# Patient Record
Sex: Female | Born: 1995
Health system: Southern US, Community
[De-identification: ages and names within clinical notes are randomized; demographics above are authoritative.]

## PROBLEM LIST (undated history)

## (undated) DIAGNOSIS — A749 Chlamydial infection, unspecified: Secondary | ICD-10-CM

## (undated) DIAGNOSIS — N390 Urinary tract infection, site not specified: Secondary | ICD-10-CM

## (undated) HISTORY — DX: Chlamydial infection, unspecified: A74.9

## (undated) HISTORY — DX: Urinary tract infection, site not specified: N39.0

## (undated) HISTORY — PX: NO PAST SURGERIES: SHX2092

---
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
## 2020-06-18 ENCOUNTER — Ambulatory Visit: Payer: Self-pay

## 2020-08-05 ENCOUNTER — Ambulatory Visit (LOCAL_COMMUNITY_HEALTH_CENTER): Payer: Self-pay

## 2020-08-05 ENCOUNTER — Other Ambulatory Visit: Payer: Self-pay

## 2020-08-05 VITALS — BP 105/69 | Ht 63.0 in | Wt 176.0 lb

## 2020-08-05 DIAGNOSIS — Z3201 Encounter for pregnancy test, result positive: Secondary | ICD-10-CM

## 2020-08-05 LAB — PREGNANCY, URINE: Preg Test, Ur: POSITIVE — AB

## 2020-08-05 MED ORDER — PRENATAL 27-0.8 MG PO TABS: 1.0000 | ORAL_TABLET | Freq: Every day | ORAL | 0 refills | Status: AC

## 2020-08-05 NOTE — Progress Notes (Signed)
UPT positive. Plans prenatal care at [facility].  To clerk for preadmit and prenatal appt.  Jerel Shepherd, RN

## 2021-02-16 ENCOUNTER — Other Ambulatory Visit: Payer: Self-pay

## 2021-02-16 ENCOUNTER — Emergency Department
Admission: EM | Admit: 2021-02-16 | Discharge: 2021-02-17 | Disposition: A | Discharge status: Home / Self Care | Payer: Self-pay | Attending: Emergency Medicine | Admitting: Emergency Medicine

## 2021-02-16 ENCOUNTER — Emergency Department: Payer: Self-pay

## 2021-02-16 DIAGNOSIS — R519 Headache, unspecified: Secondary | ICD-10-CM | POA: Insufficient documentation

## 2021-02-16 DIAGNOSIS — Z5321 Procedure and treatment not carried out due to patient leaving prior to being seen by health care provider: Secondary | ICD-10-CM | POA: Insufficient documentation

## 2021-02-16 MED ORDER — ACETAMINOPHEN 325 MG PO TABS
650.0000 mg | ORAL_TABLET | Freq: Once | ORAL | Status: AC
  Administered 2021-02-16: 650 mg via ORAL
  Filled 2021-02-16: qty 2

## 2021-02-16 NOTE — ED Provider Notes (Signed)
Emergency Medicine Provider Triage Evaluation Note  [name] , a 25 y.o. female  was evaluated in triage.  Pt complains of headache, fever, chills and body aches.  She reports passing out in the shower yesterday, hitting her head.  She states she did lose consciousness for a few minutes.  She has complained of headache ever since.  Mild nausea without vomiting.  Mild photophobia.  No vision changes, mild neck pain without radicular symptoms.  No chest pain or shortness of breath.  No palpitations.  Review of Systems  Positive: Fever, chills, headache, head injury Negative: Chest pain, shortness of breath, palpitations, cough, runny nose, congestion, sore throat  Physical Exam  BP 111/86   Pulse 97   Temp (!) 101.3 F (38.5 C)   Resp 18   LMP 02/16/2021 (Exact Date)   SpO2 98%   Breastfeeding Unknown  Gen:   Awake, no distress alert and oriented x3 Resp:  Normal effort no wheezing rales or rhonchi.  No respiratory distress. MSK:   Moves extremities without difficulty  Other:  Pupils equal round reactive to light.  No cervical spinous process tenderness.  Medical Decision Making  Medically screening exam initiated at 6:46 PM.  Appropriate orders placed.  Wallene Dales Mendiola was informed that the remainder of the evaluation will be completed by another provider, this initial triage assessment does not replace that evaluation, and the importance of remaining in the ED until their evaluation is complete.     Evon Slack, PA-C 02/16/21 1849    Concha Se, MD 02/16/21 2221

## 2021-02-16 NOTE — ED Triage Notes (Addendum)
Pt comes with c/o headache fever and body aches.

## 2021-02-17 NOTE — ED Notes (Addendum)
No answer when called several times from lobby; no answer when phone # listed in chart called 

## 2021-02-17 NOTE — ED Notes (Signed)
No answer when called several times from lobby 

## 2022-02-08 ENCOUNTER — Ambulatory Visit (LOCAL_COMMUNITY_HEALTH_CENTER): Payer: Self-pay

## 2022-02-08 VITALS — BP 110/80 | Ht 63.0 in | Wt 177.0 lb

## 2022-02-08 DIAGNOSIS — Z3201 Encounter for pregnancy test, result positive: Secondary | ICD-10-CM

## 2022-02-08 LAB — PREGNANCY, URINE: Preg Test, Ur: POSITIVE — AB

## 2022-02-08 MED ORDER — PRENATAL 27-0.8 MG PO TABS: 1.0000 | ORAL_TABLET | Freq: Every day | ORAL | 0 refills | Status: AC

## 2022-02-08 NOTE — Progress Notes (Addendum)
UPT positive. Plans prenatal care at [facility]. Confirms fetal movement.   The patient was dispensed prenatal vitamins #100 today. I provided counseling today regarding the medication. We discussed the medication, the side effects and when to call clinic. Patient given the opportunity to ask questions. Questions answered.   Clerks at lunch when visit ended. Pt plans to return to schedule first prenatal appt. Jerel Shepherd, RN

## 2022-03-08 ENCOUNTER — Encounter: Payer: Self-pay | Admitting: Nurse Practitioner

## 2022-03-08 ENCOUNTER — Ambulatory Visit: Payer: Medicaid Other | Admitting: Family Medicine

## 2022-03-08 VITALS — BP 102/58 | HR 101 | Temp 97.0°F | Wt 178.2 lb

## 2022-03-08 DIAGNOSIS — O99213 Obesity complicating pregnancy, third trimester: Secondary | ICD-10-CM

## 2022-03-08 DIAGNOSIS — Z23 Encounter for immunization: Secondary | ICD-10-CM

## 2022-03-08 DIAGNOSIS — Z124 Encounter for screening for malignant neoplasm of cervix: Secondary | ICD-10-CM

## 2022-03-08 DIAGNOSIS — Z3480 Encounter for supervision of other normal pregnancy, unspecified trimester: Secondary | ICD-10-CM | POA: Insufficient documentation

## 2022-03-08 DIAGNOSIS — Z3483 Encounter for supervision of other normal pregnancy, third trimester: Secondary | ICD-10-CM

## 2022-03-08 DIAGNOSIS — O09299 Supervision of pregnancy with other poor reproductive or obstetric history, unspecified trimester: Secondary | ICD-10-CM

## 2022-03-08 LAB — HEMOGLOBIN, FINGERSTICK: Hemoglobin: 11.2 g/dL (ref 11.1–15.9)

## 2022-03-08 NOTE — Progress Notes (Signed)
Patient here as a New OB at [redacted]w[redacted]d.   Patient agreed to MT21 genetic testing. 28 week labs done today.   Hgb reviewed - no treatment indicated.   [facility] [place] referral faxed with confirmation.   Al Decant, RN

## 2022-03-08 NOTE — Progress Notes (Addendum)
[place] [facility]   INITIAL PRENATAL VISIT NOTE  Subjective:  [name] is a 26 y.o. [id number] at [redacted]w[redacted]d being seen today to start prenatal care at the [facility] Department.  She is currently monitored for the following issues for this low-risk pregnancy and has History of postpartum hemorrhage, currently pregnant and Supervision of other normal pregnancy, antepartum on their problem list.  Patient reports  fatigue, dizziness. .  Contractions: Not present. Vag. Bleeding: None.  Movement: Present. Denies leaking of fluid.   Indications for ASA therapy (per uptodate) One of the following: Previous pregnancy with preeclampsia, especially early onset and with an adverse outcome No Multifetal gestation No Chronic hypertension No Type 1 or 2 diabetes mellitus No Chronic kidney disease No Autoimmune disease (antiphospholipid syndrome, systemic lupus erythematosus) No  Two or more of the following: Nulliparity No Obesity (body mass index >30 kg/m2) Yes Family history of preeclampsia in mother or sister No Age ?35 years No Sociodemographic characteristics (African American race, low socioeconomic level) No Personal risk factors (eg, previous pregnancy with low birth weight or small for gestational age infant, previous adverse pregnancy outcome [eg, stillbirth], interval >10 years between pregnancies) No   The following portions of the patient's history were reviewed and updated as appropriate: allergies, current medications, past family history, past medical history, past social history, past surgical history and problem list. Problem list updated.  Objective:   Vitals:   03/08/22 0906  BP: (!) 102/58  Pulse: (!) 101  Temp: (!) 97 F (36.1 C)  Weight: 178 lb 3.2 oz (80.8 kg)    Fetal Status:   Fundal Height: 28 cm Movement: Present     Physical Exam Vitals and nursing note reviewed.  Constitutional:      General: She is  not in acute distress.    Appearance: Normal appearance. She is well-developed.     Comments: Pregnant female  HENT:     Head: Normocephalic and atraumatic.     Right Ear: External ear normal.     Left Ear: External ear normal.     Nose: Nose normal. No congestion or rhinorrhea.     Mouth/Throat:     Lips: Pink.     Mouth: Mucous membranes are moist.     Dentition: Normal dentition. No dental caries.     Pharynx: Oropharynx is clear. Uvula midline.     Comments: Dentition: WNL Eyes:     General: No scleral icterus.    Conjunctiva/sclera: Conjunctivae normal.  Neck:     Thyroid: No thyroid mass or thyromegaly.  Cardiovascular:     Rate and Rhythm: Normal rate.     Pulses: Normal pulses.     Comments: Extremities are warm and well perfused Pulmonary:     Effort: Pulmonary effort is normal.     Breath sounds: Normal breath sounds.  Chest:     Chest wall: No mass or tenderness.  Breasts:    Tanner Score is 5.     Breasts are symmetrical.     Right: Normal. No mass, nipple discharge or skin change.     Left: Normal. No mass, nipple discharge or skin change.  Abdominal:     General: Abdomen is flat.     Palpations: Abdomen is soft.     Tenderness: There is no abdominal tenderness. There is no guarding or rebound.     Comments: Gravid  Genitourinary:    General: Normal vulva.     Exam  position: Lithotomy position.     Pubic Area: No rash.      Labia:        Right: No rash.        Left: No rash.      Vagina: Normal. No vaginal discharge.     Cervix: Normal.     Uterus: Normal. Enlarged (Gravid 28cm). Not tender.      Adnexa: Right adnexa normal and left adnexa normal.     Rectum: Normal. No external hemorrhoid.  Musculoskeletal:        General: Normal range of motion.     Cervical back: Normal range of motion and neck supple.     Right lower leg: No edema.     Left lower leg: No edema.  Lymphadenopathy:     Cervical: No cervical adenopathy.     Upper Body:     Right  upper body: No axillary adenopathy.     Left upper body: No axillary adenopathy.  Skin:    General: Skin is warm and dry.     Capillary Refill: Capillary refill takes less than 2 seconds.     Findings: No rash.  Neurological:     Mental Status: She is alert and oriented to person, place, and time.     Assessment and Plan:  Pregnancy: [id number] at [redacted]w[redacted]d  1. History of postpartum hemorrhage, currently pregnant LD aware  2. Encounter for supervision of other normal pregnancy in third trimester Reviewed care at [place] with delivery at [facility] planned Paper order for [facility] [place] filled out today  - Prenatal Profile I - MaterniT21 PLUS Core - Tdap vaccine greater than or equal to 7yo IM - QuantiFERON-TB Gold Plus - Glucose, 1 hour gestational - Hgb A1c w/o eAG - Comprehensive metabolic panel - Protein / creatinine ratio, urine  (Spot) - TSH - Hemoglobin, fingerstick  3. Severe obesity due to excess calories affecting pregnancy in third trimester ([place]) - Hgb A1c w/o eAG - Comprehensive metabolic panel - Protein / creatinine ratio, urine  (Spot) - TSH   Discussed overview of care and coordination with inpatient delivery practices including [facility], [facility], [facility] and [facility].    Preterm labor symptoms and general obstetric precautions including but not limited to vaginal bleeding, contractions, leaking of fluid and fetal movement were reviewed in detail with the patient.  Please refer to After Visit Summary for other counseling recommendations.   Return in about 2 weeks (around 03/22/2022) for Routine prenatal care.  No future appointments.  Caren Macadam, MD

## 2022-03-09 LAB — PROTEIN / CREATININE RATIO, URINE
Creatinine, Urine: 257 mg/dL
Protein, Ur: 70.5 mg/dL
Protein/Creat Ratio: 274 mg/g creat — ABNORMAL HIGH (ref 0–200)

## 2022-03-10 LAB — IGP, RFX APTIMA HPV ASCU: PAP Smear Comment: 0

## 2022-03-15 ENCOUNTER — Telehealth: Payer: Self-pay | Admitting: Family Medicine

## 2022-03-15 LAB — PREGNANCY, INITIAL SCREEN
Antibody Screen: NEGATIVE
Basophils Absolute: 0 10*3/uL (ref 0.0–0.2)
Basos: 0 %
Bilirubin, UA: NEGATIVE
Chlamydia trachomatis, NAA: NEGATIVE
EOS (ABSOLUTE): 0.2 10*3/uL (ref 0.0–0.4)
Eos: 2 %
Glucose, UA: NEGATIVE
HCV Ab: NONREACTIVE
HIV Screen 4th Generation wRfx: NONREACTIVE
Hematocrit: 31.9 % — ABNORMAL LOW (ref 34.0–46.6)
Hemoglobin: 10.7 g/dL — ABNORMAL LOW (ref 11.1–15.9)
Hepatitis B Surface Ag: NEGATIVE
Immature Grans (Abs): 0.1 10*3/uL (ref 0.0–0.1)
Immature Granulocytes: 1 %
Lymphocytes Absolute: 1.5 10*3/uL (ref 0.7–3.1)
Lymphs: 14 %
MCH: 28.8 pg (ref 26.6–33.0)
MCHC: 33.5 g/dL (ref 31.5–35.7)
MCV: 86 fL (ref 79–97)
Monocytes Absolute: 0.6 10*3/uL (ref 0.1–0.9)
Monocytes: 6 %
Neisseria Gonorrhoeae by PCR: NEGATIVE
Neutrophils Absolute: 7.9 10*3/uL — ABNORMAL HIGH (ref 1.4–7.0)
Neutrophils: 77 %
Nitrite, UA: POSITIVE — AB
Platelets: 205 10*3/uL (ref 150–450)
RBC, UA: NEGATIVE
RBC: 3.71 x10E6/uL — ABNORMAL LOW (ref 3.77–5.28)
RDW: 12.6 % (ref 11.7–15.4)
RPR Ser Ql: NONREACTIVE
Rh Factor: POSITIVE
Rubella Antibodies, IGG: <0.9 index — ABNORMAL LOW (ref >0.99)
Specific Gravity, UA: >=1.03 — AB (ref 1.005–1.030)
Urobilinogen, Ur: 0.2 mg/dL (ref 0.2–1.0)
WBC: 10.2 10*3/uL (ref 3.4–10.8)
pH, UA: 5.5 (ref 5.0–7.5)

## 2022-03-15 LAB — MATERNIT 21 PLUS CORE, BLOOD
Fetal Fraction: 10
Result (T21): NEGATIVE
Trisomy 13 (Patau syndrome): NEGATIVE
Trisomy 18 (Edwards syndrome): NEGATIVE
Trisomy 21 (Down syndrome): NEGATIVE

## 2022-03-15 LAB — QUANTIFERON-TB GOLD PLUS
QuantiFERON Mitogen Value: 2.91 IU/mL
QuantiFERON Nil Value: 0.07 IU/mL
QuantiFERON TB1 Ag Value: 0.07 IU/mL
QuantiFERON TB2 Ag Value: 0.07 IU/mL
QuantiFERON-TB Gold Plus: NEGATIVE

## 2022-03-15 LAB — MICROSCOPIC EXAMINATION
Casts: NONE SEEN /lpf
Epithelial Cells (non renal): >10 /hpf — AB (ref 0–10)
RBC, Urine: NONE SEEN /hpf (ref 0–2)

## 2022-03-15 LAB — TSH: TSH: 0.974 u[IU]/mL (ref 0.450–4.500)

## 2022-03-15 LAB — COMPREHENSIVE METABOLIC PANEL
ALT: 6 IU/L (ref 0–32)
AST: 9 IU/L (ref 0–40)
Albumin/Globulin Ratio: 1.3 (ref 1.2–2.2)
Albumin: 3.7 g/dL — ABNORMAL LOW (ref 4.0–5.0)
Alkaline Phosphatase: 99 IU/L (ref 44–121)
BUN/Creatinine Ratio: 10 (ref 9–23)
BUN: 5 mg/dL — ABNORMAL LOW (ref 6–20)
Bilirubin Total: 0.2 mg/dL (ref 0.0–1.2)
CO2: 19 mmol/L — ABNORMAL LOW (ref 20–29)
Calcium: 9.3 mg/dL (ref 8.7–10.2)
Chloride: 100 mmol/L (ref 96–106)
Creatinine, Ser: 0.48 mg/dL — ABNORMAL LOW (ref 0.57–1.00)
Globulin, Total: 2.8 g/dL (ref 1.5–4.5)
Glucose: 119 mg/dL — ABNORMAL HIGH (ref 70–99)
Potassium: 4 mmol/L (ref 3.5–5.2)
Sodium: 137 mmol/L (ref 134–144)
Total Protein: 6.5 g/dL (ref 6.0–8.5)
eGFR: 136 mL/min/{1.73_m2} (ref >59)

## 2022-03-15 LAB — URINE CULTURE, OB REFLEX

## 2022-03-15 LAB — HCV INTERPRETATION

## 2022-03-15 LAB — GLUCOSE, 1 HOUR GESTATIONAL: Gestational Diabetes Screen: 106 mg/dL (ref 70–139)

## 2022-03-15 LAB — HGB A1C W/O EAG: Hgb A1c MFr Bld: 4.9 % (ref 4.8–5.6)

## 2022-03-15 NOTE — Telephone Encounter (Signed)
Pt wants someone from the clinic to call her in regards to the results of her labs from the last visit.

## 2022-03-15 NOTE — Telephone Encounter (Signed)
Returned call to patient who is looking for results from her appointment last week. Patient wants to find out results of [id number] so she can find out the sex of her baby. Results not in yet, patient counseled to call back this Friday if she still doesn't see results in [place]. If no results by Friday, RN can call [facility] to try to get hard copy of results. Patient states understanding.Jenetta Downer, RN

## 2022-03-16 DIAGNOSIS — O99019 Anemia complicating pregnancy, unspecified trimester: Secondary | ICD-10-CM | POA: Insufficient documentation

## 2022-03-16 DIAGNOSIS — O09899 Supervision of other high risk pregnancies, unspecified trimester: Secondary | ICD-10-CM | POA: Insufficient documentation

## 2022-03-22 ENCOUNTER — Ambulatory Visit: Payer: Medicaid Other | Admitting: Advanced Practice Midwife

## 2022-03-22 ENCOUNTER — Encounter: Payer: Self-pay | Admitting: Advanced Practice Midwife

## 2022-03-22 VITALS — BP 100/65 | HR 89 | Temp 98.2°F | Wt 178.6 lb

## 2022-03-22 DIAGNOSIS — O99019 Anemia complicating pregnancy, unspecified trimester: Secondary | ICD-10-CM

## 2022-03-22 DIAGNOSIS — O99013 Anemia complicating pregnancy, third trimester: Secondary | ICD-10-CM

## 2022-03-22 DIAGNOSIS — Z3480 Encounter for supervision of other normal pregnancy, unspecified trimester: Secondary | ICD-10-CM

## 2022-03-22 DIAGNOSIS — F5089 Other specified eating disorder: Secondary | ICD-10-CM

## 2022-03-22 DIAGNOSIS — O2343 Unspecified infection of urinary tract in pregnancy, third trimester: Secondary | ICD-10-CM

## 2022-03-22 DIAGNOSIS — O2340 Unspecified infection of urinary tract in pregnancy, unspecified trimester: Secondary | ICD-10-CM

## 2022-03-22 DIAGNOSIS — O0933 Supervision of pregnancy with insufficient antenatal care, third trimester: Secondary | ICD-10-CM

## 2022-03-22 DIAGNOSIS — O0930 Supervision of pregnancy with insufficient antenatal care, unspecified trimester: Secondary | ICD-10-CM

## 2022-03-22 DIAGNOSIS — Z3483 Encounter for supervision of other normal pregnancy, third trimester: Secondary | ICD-10-CM

## 2022-03-22 MED ORDER — NITROFURANTOIN MONOHYD MACRO 100 MG PO CAPS: 100.0000 mg | ORAL_CAPSULE | Freq: Two times a day (BID) | ORAL | 0 refills | Status: AC

## 2022-03-22 MED ORDER — IRON (FERROUS SULFATE) 325 (65 FE) MG PO TABS: 1.0000 | ORAL_TABLET | Freq: Every day | ORAL | 0 refills | Status: AC

## 2022-03-22 NOTE — Progress Notes (Signed)
[facility]  PRENATAL VISIT NOTE  Subjective:  [name] is a 26 y.o. [phone] at [redacted]w[redacted]d being seen today for ongoing prenatal care.  She is currently monitored for the following issues for this low-risk pregnancy and has History of postpartum hemorrhage, currently pregnant; Supervision of other normal pregnancy, antepartum; Anemia of mother in pregnancy, antepartum; Rubella non-immune status, antepartum; UTI (urinary tract infection) during pregnancy 03/08/22 >100,000 E. Coli; Late prenatal care 28 wks; and Pica ice on their problem list.  Patient reports fatigue.  Contractions: Not present. Vag. Bleeding: None.  Movement: Present. Denies leaking of fluid/ROM.   The following portions of the patient's history were reviewed and updated as appropriate: allergies, current medications, past family history, past medical history, past social history, past surgical history and problem list. Problem list updated.  Objective:   Vitals:   03/22/22 0917  BP: 100/65  Pulse: 89  Temp: 98.2 F (36.8 C)  Weight: 178 lb 9.6 oz (81 kg)    Fetal Status: Fetal Heart Rate (bpm): 140 Fundal Height: 31 cm Movement: Present     General:  Alert, oriented and cooperative. Patient is in no acute distress.  Skin: Skin is warm and dry. No rash noted.   Cardiovascular: Normal heart rate noted  Respiratory: Normal respiratory effort, no problems with respiration noted  Abdomen: Soft, gravid, appropriate for gestational age.  Pain/Pressure: Absent     Pelvic: Cervical exam deferred        Extremities: Normal range of motion.     Mental Status: Normal mood and affect. Normal behavior. Normal judgment and thought content.   Assessment and Plan:  Pregnancy: [id number] at [redacted]w[redacted]d  1. Anemia of mother in pregnancy, antepartum Given Feso4 today to take I daily with oj Counseled to stop eating ice daily  - Iron, Ferrous Sulfate, 325 (65 Fe) MG TABS; Take 1 tablet  by mouth daily at 6 (six) AM.  Dispense: 100 tablet; Refill: 0 - Fe+CBC/D/Plt+TIBC+Fer+Retic  2. Supervision of other normal pregnancy, antepartum -1 lb 6.4 oz (-0.635 kg) NIPS neg 03/08/22 Spot protein/creat ratio=274 03/08/22 1 hour glucose=106 on 03/08/22 C&S + E. Coli 03/08/22 Working 10-11 hrs/wk and living with her 2 kids (7,5) Walking 2x/wk x 20 min C/o poor appetite and fatigue--accepts referral for counseling with Milton Ferguson, LCSW and contact card given Has dating u/s 03/30/22 Dental referral made  3. Urinary tract infection in mother during pregnancy, antepartum Dx'd 03/08/22 Drinks 12 oz coke/day--counseled to stop Given Macrobid 100 mg po BID x 7 days with instructions The patient was dispensed Macrobid today. I provided counseling today regarding the medication. We discussed the medication, the side effects and when to call clinic. Patient given the opportunity to ask questions. Questions answered.   - nitrofurantoin, macrocrystal-monohydrate, (MACROBID) 100 MG capsule; Take 1 capsule (100 mg total) by mouth 2 (two) times daily.  Dispense: 14 capsule; Refill: 0  4. Late prenatal care 28 wks Pt states was in denial  5. Pica ice 1 c. Daily--counseled not to eat   Preterm labor symptoms and general obstetric precautions including but not limited to vaginal bleeding, contractions, leaking of fluid and fetal movement were reviewed in detail with the patient. Please refer to After Visit Summary for other counseling recommendations.  Return in about 2 weeks (around 04/05/2022) for routine PNC.  No future appointments.  Herbie Saxon, CNM

## 2022-03-22 NOTE — Progress Notes (Addendum)
Verified has [facility] contact card. Aware of [facility] [place] 03/30/2022. Anemia profile today and iron initiated per standing order. (Hgb on Prenatal Profile 03/08/2022 resulted as 10.7). Per request of E. Sciora CNM, behavioral health consent signed. Rich Number, RN

## 2022-03-25 NOTE — Progress Notes (Signed)
Chart reviewed by Pharmacist  Suzanne Walker PharmD, Contract Pharmacist at [facility]  

## 2022-03-31 NOTE — Addendum Note (Signed)
Addended by: Cletis Media on: 03/31/2022 11:51 AM   Modules accepted: Orders

## 2022-04-06 ENCOUNTER — Telehealth: Payer: Self-pay

## 2022-04-06 ENCOUNTER — Ambulatory Visit: Payer: Medicaid Other

## 2022-04-06 NOTE — Telephone Encounter (Signed)
Call to patient to rescheduled missed [facility] appt today.   Spoke with patient and appointment rescheduled for 04/15/22 at 0800 arrival time.   Earlyne Iba, RN

## 2022-04-15 ENCOUNTER — Ambulatory Visit: Payer: Medicaid Other | Admitting: Advanced Practice Midwife

## 2022-04-15 VITALS — BP 102/66 | HR 79 | Temp 97.1°F | Wt 179.8 lb

## 2022-04-15 DIAGNOSIS — F5089 Other specified eating disorder: Secondary | ICD-10-CM

## 2022-04-15 DIAGNOSIS — Z3480 Encounter for supervision of other normal pregnancy, unspecified trimester: Secondary | ICD-10-CM

## 2022-04-15 DIAGNOSIS — O99019 Anemia complicating pregnancy, unspecified trimester: Secondary | ICD-10-CM

## 2022-04-15 DIAGNOSIS — O0930 Supervision of pregnancy with insufficient antenatal care, unspecified trimester: Secondary | ICD-10-CM

## 2022-04-15 DIAGNOSIS — O99013 Anemia complicating pregnancy, third trimester: Secondary | ICD-10-CM

## 2022-04-15 DIAGNOSIS — O0933 Supervision of pregnancy with insufficient antenatal care, third trimester: Secondary | ICD-10-CM

## 2022-04-15 DIAGNOSIS — Z3483 Encounter for supervision of other normal pregnancy, third trimester: Secondary | ICD-10-CM

## 2022-04-15 DIAGNOSIS — O2343 Unspecified infection of urinary tract in pregnancy, third trimester: Secondary | ICD-10-CM

## 2022-04-15 LAB — HEMOGLOBIN, FINGERSTICK: Hemoglobin: 11 g/dL — ABNORMAL LOW (ref 11.1–15.9)

## 2022-04-15 NOTE — Addendum Note (Signed)
Addended by: Veatrice Kells on: 04/15/2022 09:20 AM   Modules accepted: Orders

## 2022-04-15 NOTE — Progress Notes (Signed)
[facility]  PRENATAL VISIT NOTE  Subjective:  [name] is a 26 y.o. [phone] at [redacted]w[redacted]d being seen today for ongoing prenatal care.  She is currently monitored for the following issues for this high-risk pregnancy and has History of postpartum hemorrhage, currently pregnant; Supervision of other normal pregnancy, antepartum; Anemia of mother in pregnancy, antepartum; Rubella non-immune status, antepartum; UTI (urinary tract infection) during pregnancy 03/08/22 >100,000 E. Coli; Late prenatal care 28 wks; and Pica ice on their problem list.  Patient reports no complaints.  Contractions: Not present. Vag. Bleeding: None.  Movement: Present. Denies leaking of fluid/ROM.   The following portions of the patient's history were reviewed and updated as appropriate: allergies, current medications, past family history, past medical history, past social history, past surgical history and problem list. Problem list updated.  Objective:   Vitals:   04/15/22 0827  BP: 102/66  Pulse: 79  Temp: (!) 97.1 F (36.2 C)  Weight: 179 lb 12.8 oz (81.6 kg)    Fetal Status: Fetal Heart Rate (bpm): 140 Fundal Height: 35 cm Movement: Present     General:  Alert, oriented and cooperative. Patient is in no acute distress.  Skin: Skin is warm and dry. No rash noted.   Cardiovascular: Normal heart rate noted  Respiratory: Normal respiratory effort, no problems with respiration noted  Abdomen: Soft, gravid, appropriate for gestational age.  Pain/Pressure: Absent     Pelvic: Cervical exam deferred        Extremities: Normal range of motion.  Edema: None  Mental Status: Normal mood and affect. Normal behavior. Normal judgment and thought content.   Assessment and Plan:  Pregnancy: [id number] at [redacted]w[redacted]d  1. Anemia during pregnancy in third trimester Pt states taking FeSo4 BID with oj Anemia panel ordered  03/22/22 was not received by [employer]  -  Fe+CBC/D/Plt+TIBC+Fer+Retic - Hemoglobin, venipuncture  2. Urinary tract infection in mother during third trimester of pregnancy Pt did not take 2 days worth of antibiotics given to her on 03/22/22 (dx'd on 03/08/22) C&S TOC today  - Urine Culture  3. Late prenatal care 28 wks   4. Pica ice Pt says she stopped eating ice  5. Supervision of other normal pregnancy, antepartum Working 10-11 hrs/wk Walking 2x/wk x 20 min -3.2 oz (-0.091 kg) Dental apt rescheduled to 05/2022 Hasn't made apt with Kathreen Cosier, LCSW for counseling yet but plans to do so Reviewed 03/30/22 first u/s at 35 6/7 with AFI wnl, anatomy wnl, EFW=63%, BPP 8/8 Next growth u/s 04/28/22 NIPS 03/08/22=neg    Preterm labor symptoms and general obstetric precautions including but not limited to vaginal bleeding, contractions, leaking of fluid and fetal movement were reviewed in detail with the patient. Please refer to After Visit Summary for other counseling recommendations.  Return in about 2 weeks (around 04/29/2022) for routine PNC.  No future appointments.  Alberteen Spindle, CNM

## 2022-04-15 NOTE — Progress Notes (Addendum)
Client kept 03/30/2022 [facility] [place] appt and aware of next [place] appt 04/28/2022. Verified client has [facility] contact card. Treated for UTI 03/22/2022 and per client, still has 4 pills remaining on prescription. Correctly verbalizes how to take iron and prenatal vitamin. Taking iron tablet with juice. Anemia panel ordered 03/22/2022 not received by [facility]. Hgb and anemia panel drawn today. Jossie Ng, RN Per Shawn in [facility] lab, anemia profile needs to be reordered due to a problem and they can print the lab order off in the lab. Above completed. Jossie Ng, RN Hgb = -11.0 and to continue iron and PNV daily. Jossie Ng, RN

## 2022-04-16 LAB — FE+CBC/D/PLT+TIBC+FER+RETIC
Basophils Absolute: 0 10*3/uL (ref 0.0–0.2)
Basos: 0 %
EOS (ABSOLUTE): 0.1 10*3/uL (ref 0.0–0.4)
Eos: 1 %
Ferritin: 42 ng/mL (ref 15–150)
Hematocrit: 34 % (ref 34.0–46.6)
Hemoglobin: 11.2 g/dL (ref 11.1–15.9)
Immature Grans (Abs): 0 10*3/uL (ref 0.0–0.1)
Immature Granulocytes: 1 %
Iron Saturation: 8 % — CL (ref 15–55)
Iron: 39 ug/dL (ref 27–159)
Lymphocytes Absolute: 1.9 10*3/uL (ref 0.7–3.1)
Lymphs: 25 %
MCH: 27.5 pg (ref 26.6–33.0)
MCHC: 32.9 g/dL (ref 31.5–35.7)
MCV: 83 fL (ref 79–97)
Monocytes Absolute: 0.6 10*3/uL (ref 0.1–0.9)
Monocytes: 7 %
Neutrophils Absolute: 5 10*3/uL (ref 1.4–7.0)
Neutrophils: 66 %
Platelets: 211 10*3/uL (ref 150–450)
RBC: 4.08 x10E6/uL (ref 3.77–5.28)
RDW: 14 % (ref 11.7–15.4)
Retic Ct Pct: 2.4 % (ref 0.6–2.6)
Total Iron Binding Capacity: 489 ug/dL — ABNORMAL HIGH (ref 250–450)
UIBC: 450 ug/dL — ABNORMAL HIGH (ref 131–425)
WBC: 7.6 10*3/uL (ref 3.4–10.8)

## 2022-04-20 ENCOUNTER — Telehealth: Payer: Self-pay

## 2022-04-20 ENCOUNTER — Encounter: Payer: Self-pay | Admitting: Nurse Practitioner

## 2022-04-20 DIAGNOSIS — N390 Urinary tract infection, site not specified: Secondary | ICD-10-CM | POA: Insufficient documentation

## 2022-04-20 LAB — URINE CULTURE

## 2022-04-20 NOTE — Telephone Encounter (Signed)
Per Glenna Fellows, client needs UTI treatment. Call to client and left message requesting she return call on Monday to schedule appt for treatment. Number to call provided. Jossie Ng, RN

## 2022-04-25 ENCOUNTER — Telehealth: Payer: Self-pay

## 2022-04-25 NOTE — Telephone Encounter (Signed)
Call to patient for UTI tx. Patient answered and aware to come to clinic at 08:40 on 04/26/22.   Earlyne Iba, RN

## 2022-04-26 ENCOUNTER — Ambulatory Visit: Payer: Medicaid Other | Admitting: Advanced Practice Midwife

## 2022-04-26 VITALS — BP 103/63 | HR 86 | Temp 97.4°F | Wt 182.6 lb

## 2022-04-26 DIAGNOSIS — Z3480 Encounter for supervision of other normal pregnancy, unspecified trimester: Secondary | ICD-10-CM

## 2022-04-26 DIAGNOSIS — O99019 Anemia complicating pregnancy, unspecified trimester: Secondary | ICD-10-CM

## 2022-04-26 DIAGNOSIS — O0930 Supervision of pregnancy with insufficient antenatal care, unspecified trimester: Secondary | ICD-10-CM

## 2022-04-26 DIAGNOSIS — Z3483 Encounter for supervision of other normal pregnancy, third trimester: Secondary | ICD-10-CM

## 2022-04-26 DIAGNOSIS — N390 Urinary tract infection, site not specified: Secondary | ICD-10-CM

## 2022-04-26 MED ORDER — AMOXICILLIN 250 MG PO CAPS: 250.0000 mg | ORAL_CAPSULE | Freq: Three times a day (TID) | ORAL | 0 refills | Status: AC

## 2022-04-26 NOTE — Progress Notes (Signed)
Did not complete last 2 days of ATB for UTI. Here for retreatment. BTHiele RN

## 2022-04-26 NOTE — Telephone Encounter (Signed)
Done

## 2022-04-26 NOTE — Progress Notes (Signed)
[facility]  PRENATAL VISIT NOTE  Subjective:  [name] is a 26 y.o. [phone] at [redacted]w[redacted]d being seen today for ongoing prenatal care.  She is currently monitored for the following issues for this low-risk pregnancy and has History of postpartum hemorrhage, currently pregnant; Supervision of other normal pregnancy, antepartum; Anemia of mother in pregnancy, antepartum; Rubella non-immune status, antepartum; UTI (urinary tract infection) during pregnancy 03/08/22 >100,000 E. Coli; Late prenatal care 28 wks; Pica ice; and UTI (urinary tract infection) #2 04/15/22 E. Coli on their problem list.  Patient reports no complaints.   .  .   . Denies leaking of fluid/ROM.   The following portions of the patient's history were reviewed and updated as appropriate: allergies, current medications, past family history, past medical history, past social history, past surgical history and problem list. Problem list updated.  Objective:   Vitals:   04/26/22 0851  BP: 103/63  Pulse: 86  Temp: (!) 97.4 F (36.3 C)  Weight: 182 lb 9.6 oz (82.8 kg)    Fetal Status:           General:  Alert, oriented and cooperative. Patient is in no acute distress.  Skin: Skin is warm and dry. No rash noted.   Cardiovascular: Normal heart rate noted  Respiratory: Normal respiratory effort, no problems with respiration noted  Abdomen: Soft, gravid, appropriate for gestational age.        Pelvic: Cervical exam deferred        Extremities: Normal range of motion.     Mental Status: Normal mood and affect. Normal behavior. Normal judgment and thought content.   Assessment and Plan:  Pregnancy: [id number] at [redacted]w[redacted]d  1. Late prenatal care 28 wks   2. Supervision of other normal pregnancy, antepartum Here for UTI tx only 2 lb 9.6 oz (1.179 kg) Reminded of u/s 04/28/22 Working 1 day/wk x 7 hours  3. Anemia of mother in pregnancy, antepartum Taking FeSo4 BID with  oj Denies ice pica  4. Urinary tract infection without hematuria, site unspecified UTI #1 dx'd 03/08/22 and pt noncompliant with tx given 03/22/22 TOC on 04/15/22=+E. Coli Here for tx #2 today with Amoxicillin 250 mg po TID given with instructions x 10 days The patient was dispensed Amoxicillin today. I provided counseling today regarding the medication. We discussed the medication, the side effects and when to call clinic. Patient given the opportunity to ask questions. Questions answered.   - amoxicillin (AMOXIL) 250 MG capsule; Take 1 capsule (250 mg total) by mouth 3 (three) times daily for 10 days.  Dispense: 30 capsule; Refill: 0   Preterm labor symptoms and general obstetric precautions including but not limited to vaginal bleeding, contractions, leaking of fluid and fetal movement were reviewed in detail with the patient. Please refer to After Visit Summary for other counseling recommendations.  Return in about 1 week (around 05/03/2022) for routine PNC.  Future Appointments  Date Time Provider Department Center  04/29/2022  8:55 AM [facility] PROVIDER [facility] None    Alberteen Spindle, CNM

## 2022-04-29 ENCOUNTER — Ambulatory Visit: Payer: Self-pay

## 2022-04-29 ENCOUNTER — Telehealth: Payer: Self-pay

## 2022-04-29 NOTE — Telephone Encounter (Signed)
Call to patient to notify here of missed appointment today. Patient states she forgot about today's appointment. Rescheduled appointment for 05/06/22 for 10:00am arrival time.   Earlyne Iba, RN

## 2022-05-01 NOTE — Progress Notes (Signed)
Chart reviewed by Pharmacist  Suzanne Walker PharmD, Contract Pharmacist at [facility]  

## 2022-05-06 ENCOUNTER — Ambulatory Visit: Payer: Self-pay

## 2022-05-06 ENCOUNTER — Telehealth: Payer: Self-pay

## 2022-05-06 NOTE — Telephone Encounter (Signed)
TC to patient who missed her maternity visit today. Patient now scheduled for 05/09/22 and counseled to arrive 1:00. Patient counseled that she is overbooked due to lack of appointment availability. Patient aware that she may have to wait for a while and this appointment. Burt Knack, RN

## 2022-05-09 ENCOUNTER — Ambulatory Visit: Payer: Self-pay

## 2022-05-09 ENCOUNTER — Encounter: Payer: Self-pay | Admitting: Nurse Practitioner

## 2022-05-09 DIAGNOSIS — O3663X0 Maternal care for excessive fetal growth, third trimester, not applicable or unspecified: Secondary | ICD-10-CM | POA: Insufficient documentation

## 2022-07-14 ENCOUNTER — Telehealth: Payer: Self-pay | Admitting: Family Medicine

## 2022-07-14 NOTE — Telephone Encounter (Signed)
Scheduled an appointment for patient on 08/01/2022 for a post partum visit.

## 2022-07-20 IMAGING — CT CT HEAD W/O CM
3 series · 16 of 46 positions shown, 19 images · non-contrast
Comparison: 03/19/2013

CLINICAL DATA: Recent syncopal episode yesterday with headaches and
recent loss of consciousness

EXAM:
CT HEAD WITHOUT CONTRAST
TECHNIQUE: Contiguous axial images were obtained from the base of the skull
through the vertex without intravenous contrast.

[Series 3: head wo · axial · 0.42mm/px · z∈[-131,-11]mm · 10 of 29 slices shown, 13 images]
[im 3/29  brain]
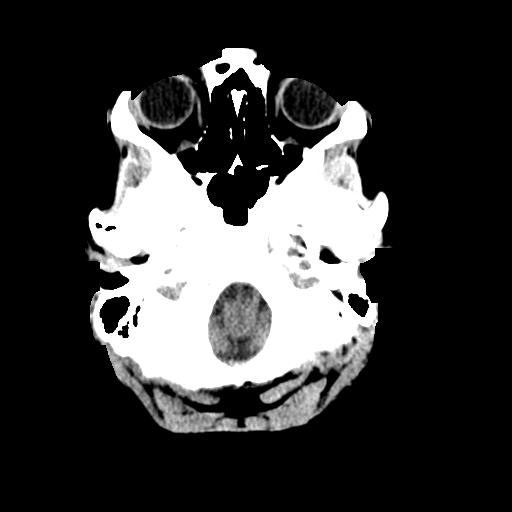
[im 3/29  bone]
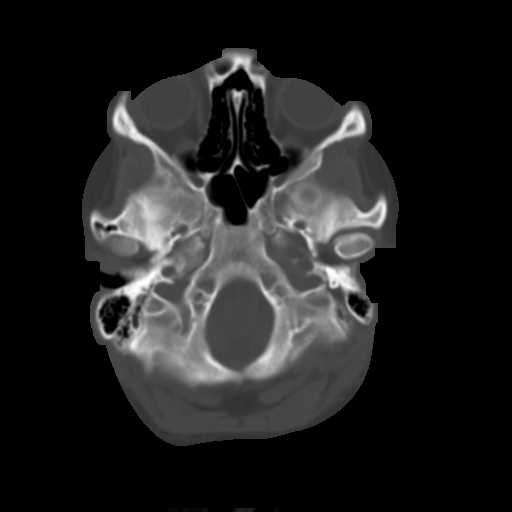
[im 6/29  brain]
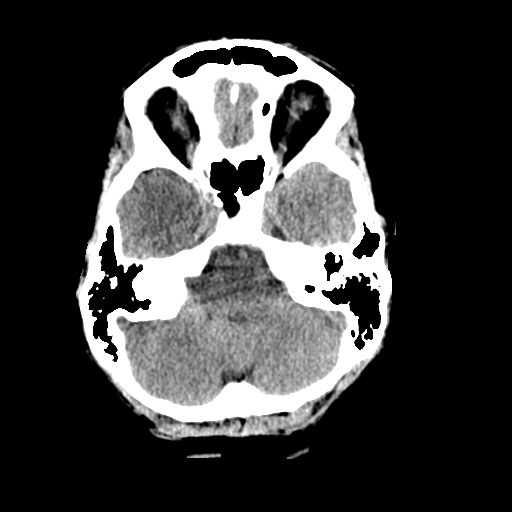
[im 8/29  brain]
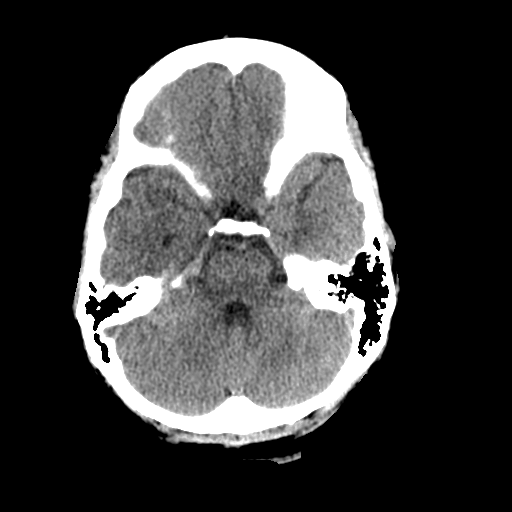
[im 11/29  brain]
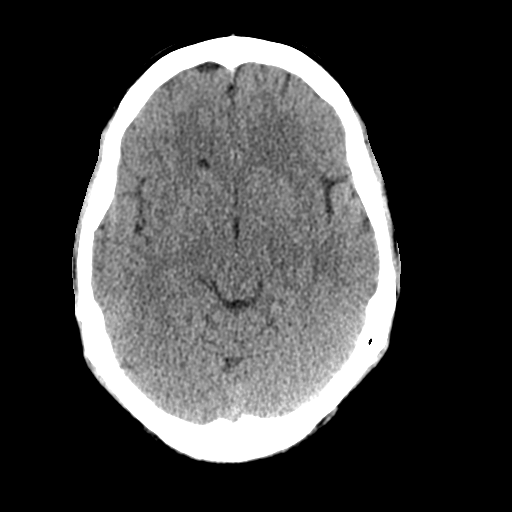
[im 14/29  brain]
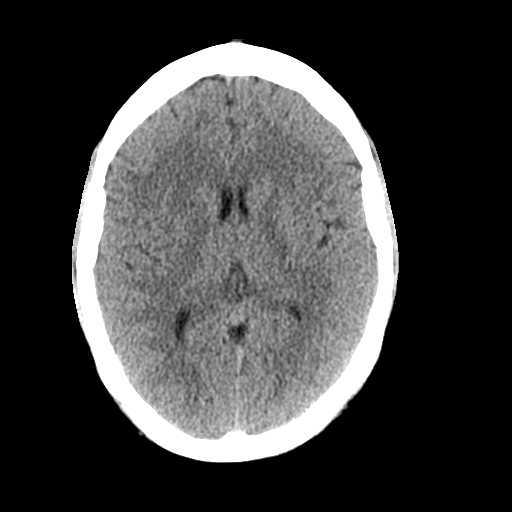
[im 14/29  bone]
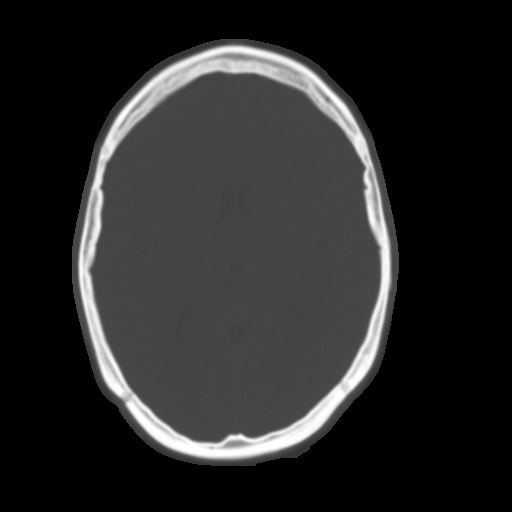
[im 16/29  brain]
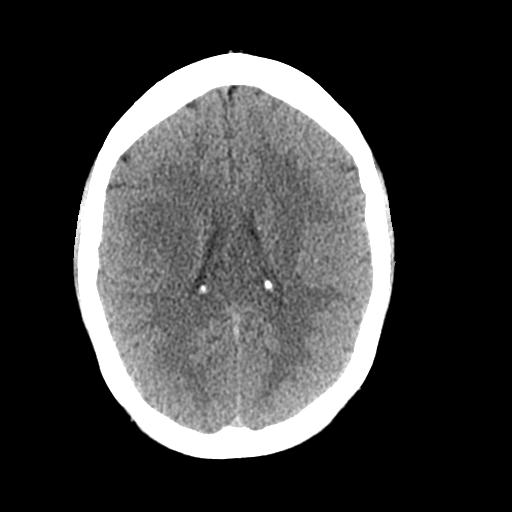
[im 19/29  brain]
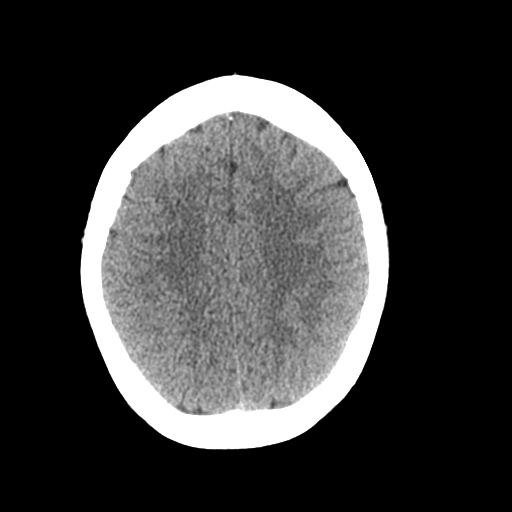
[im 22/29  brain]
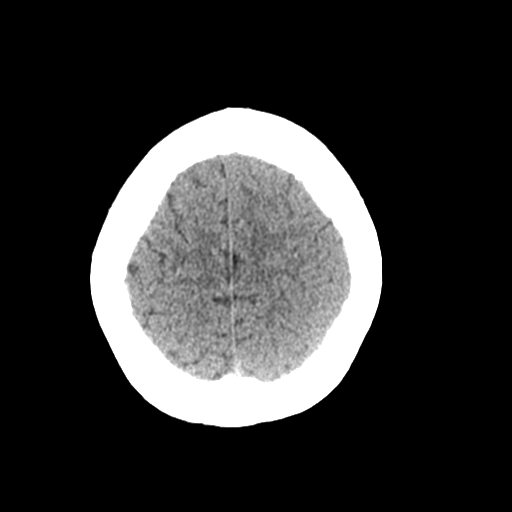
[im 24/29  brain]
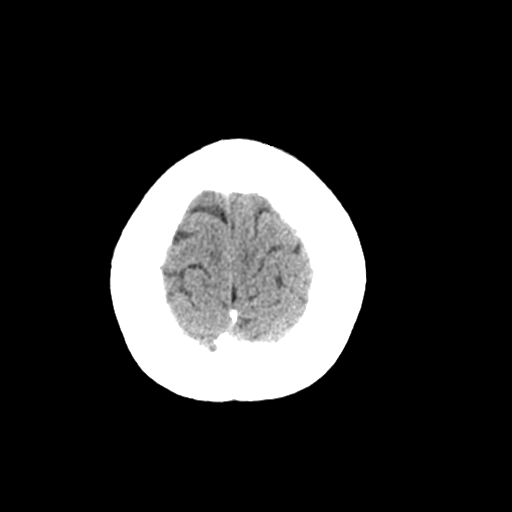
[im 24/29  bone]
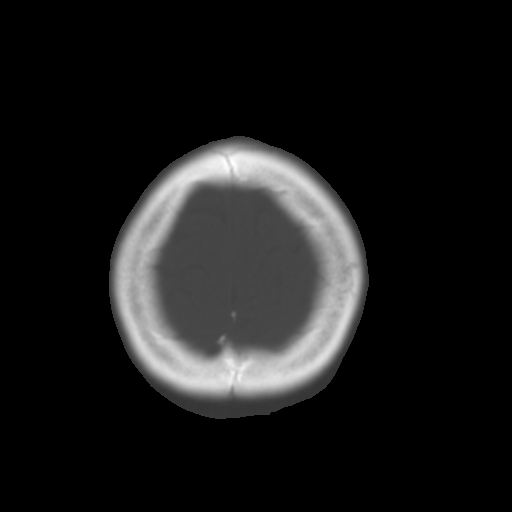
[im 27/29  brain]
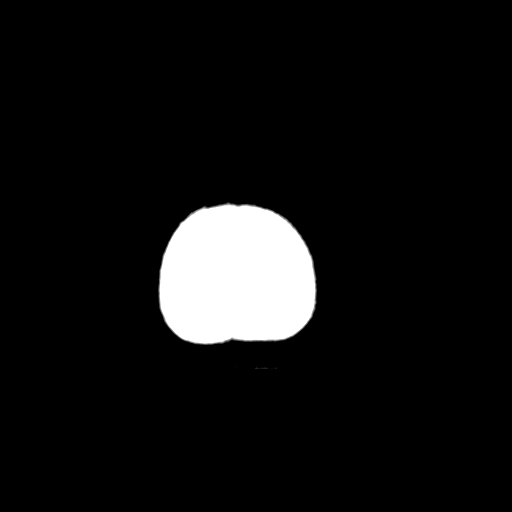

[Series 4: coronal soft tissue · coronal · 0.31mm/px · 3 of 71 slices shown]
[im 24/71  brain]
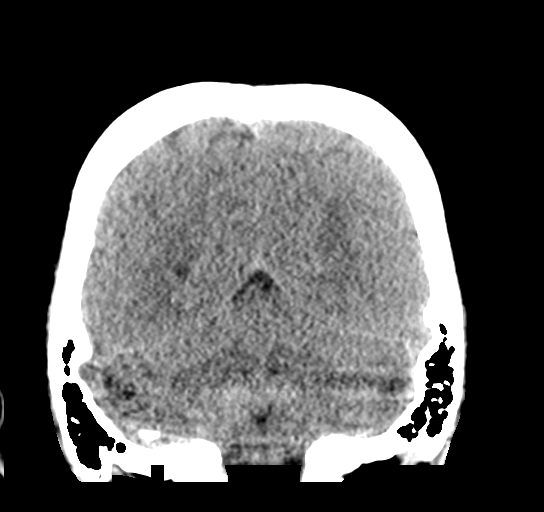
[im 32/71  brain]
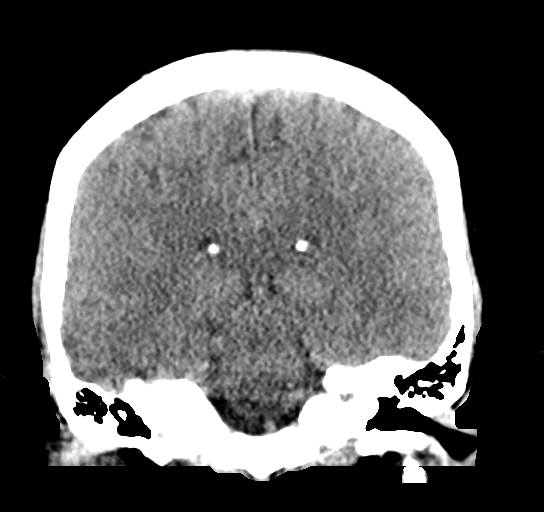
[im 39/71  brain]
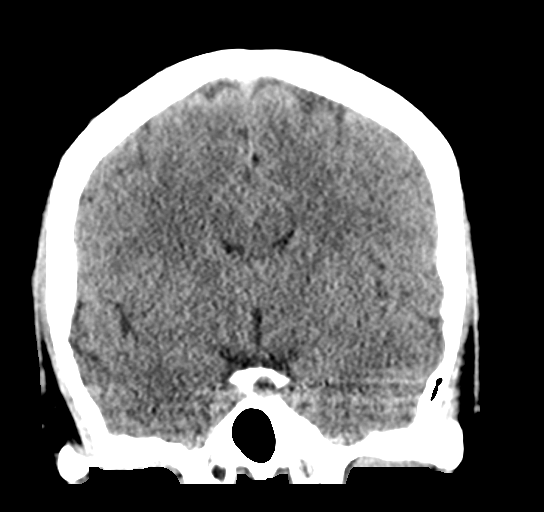

[Series 5: sagittal soft tissue · sagittal · 0.33mm/px · 3 of 57 slices shown]
[im 19/57  brain]
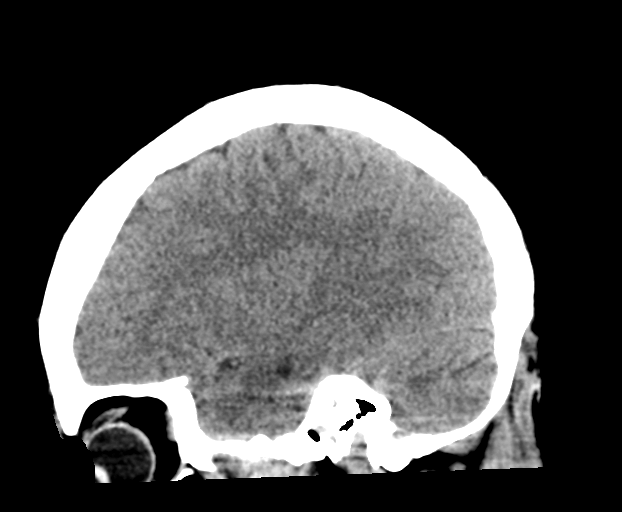
[im 29/57  brain]
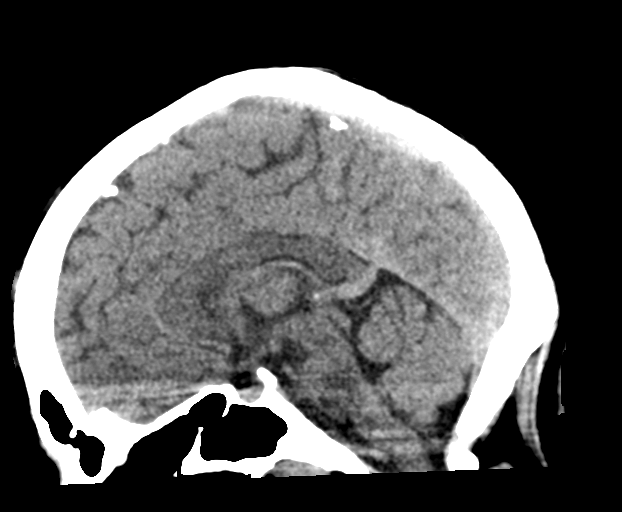
[im 38/57  brain]
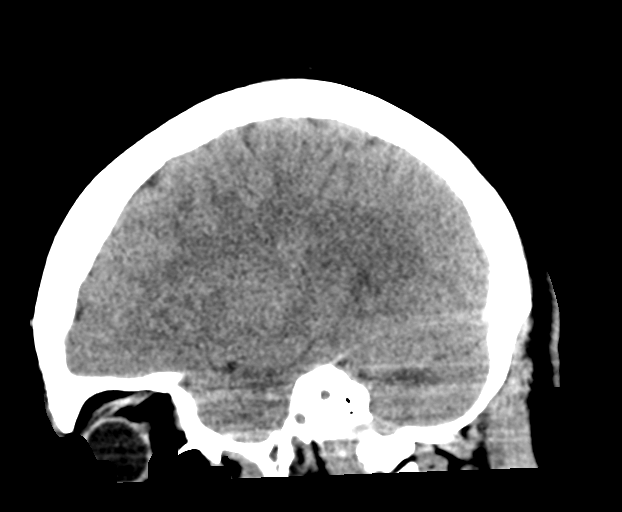

[16 of 46 positions shown; findings below may reference images not displayed]

FINDINGS: Brain: No evidence of acute infarction, hemorrhage, hydrocephalus,
extra-axial collection or mass lesion/mass effect.

Vascular: No hyperdense vessel or unexpected calcification.

Skull: Normal. Negative for fracture or focal lesion.

Sinuses/Orbits: No acute finding.

Other: None.
IMPRESSION: No acute intracranial abnormality noted.

## 2022-08-01 ENCOUNTER — Ambulatory Visit: Payer: Self-pay
